# Patient Record
Sex: Male | Born: 1995 | Race: Black or African American | Hispanic: No | Marital: Single | Smoking: Current every day smoker
Health system: Southern US, Community
[De-identification: ages and names within clinical notes are randomized; demographics above are authoritative.]

---
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
## 2017-11-02 ENCOUNTER — Emergency Department (HOSPITAL_BASED_OUTPATIENT_CLINIC_OR_DEPARTMENT_OTHER)
Admission: EM | Admit: 2017-11-02 | Discharge: 2017-11-02 | Disposition: A | Discharge status: Home / Self Care | Payer: 59 | Attending: Emergency Medicine | Admitting: Emergency Medicine

## 2017-11-02 ENCOUNTER — Encounter (HOSPITAL_BASED_OUTPATIENT_CLINIC_OR_DEPARTMENT_OTHER): Payer: Self-pay | Admitting: Emergency Medicine

## 2017-11-02 ENCOUNTER — Other Ambulatory Visit: Payer: Self-pay

## 2017-11-02 DIAGNOSIS — L02414 Cutaneous abscess of left upper limb: Secondary | ICD-10-CM | POA: Insufficient documentation

## 2017-11-02 MED ORDER — SULFAMETHOXAZOLE-TRIMETHOPRIM 800-160 MG PO TABS: 1.0000 | ORAL_TABLET | Freq: Two times a day (BID) | ORAL | 0 refills | Status: AC

## 2017-11-02 NOTE — Discharge Instructions (Signed)
If the area becomes bigger, more painful, or you have red streaks coming from it, fevers, vomiting, or any other new/concerning symptoms and return to the ER for evaluation.  Take the antibiotics to completion, even if you are feeling better.

## 2017-11-02 NOTE — ED Provider Notes (Signed)
[facility] EMERGENCY DEPARTMENT Provider Note   CSN: [id number] Arrival date & time: 11/02/17  0915     History   Chief Complaint Chief Complaint  Patient presents with  . Abscess    HPI Manuel Piesustin Mimnaugh is a 22 y.o. male.  HPI  22 year old male presents with an abscess to the left forearm.  He does not remember any trauma.  He denies any drug use or past medical problems.  He states that started about 3 days ago and started like a pimple.  He tried alcohol on it but it had no relief.  He did take the top off and has had some drainage since.  Some white with blood as well as a little bit of green.  No fevers.  Seems a little smaller today than he did yesterday.  History reviewed. No pertinent past medical history.  There are no active problems to display for this patient.   History reviewed. No pertinent surgical history.      Home Medications    Prior to Admission medications   Medication Sig Start Date End Date Taking? Authorizing Provider  Neomycin-Bacitracin-Polymyxin (NEOSPORIN EX) Apply topically.    [provider]    Family History No family history on file.  Social History Social History   Tobacco Use  . Smoking status: Never Smoker  . Smokeless tobacco: Never Used  Substance Use Topics  . Alcohol use: Yes  . Drug use: No     Allergies   Patient has no known allergies.   Review of Systems Review of Systems  Constitutional: Negative for fever.  Skin: Positive for wound.     Physical Exam Updated Vital Signs BP 116/62 (BP Location: Right Arm)   Pulse 66   Temp 98 F (36.7 C) (Oral)   Resp 16   Ht 6\' 1"  (1.854 m)   Wt 104.3 kg   SpO2 100%   BMI 30.34 kg/m   Physical Exam  Constitutional: He is oriented to person, place, and time. He appears well-developed and well-nourished. No distress.  HENT:  Head: Normocephalic.  Eyes: Right eye exhibits no discharge. Left eye exhibits no discharge.  Cardiovascular: Regular  rhythm.  Pulmonary/Chest: Effort normal.  Abdominal: He exhibits no distension.  Musculoskeletal:       Arms: Neurological: He is alert and oriented to person, place, and time.  Skin: Skin is warm and dry. He is not diaphoretic. No erythema.  Nursing note and vitals reviewed.      ED Treatments / Results  Labs (all labs ordered are listed, but only abnormal results are displayed) Labs Reviewed - No data to display  EKG None  Radiology No results found.  Procedures Procedures (including critical care time)  Medications Ordered in ED Medications - No data to display   Initial Impression / Assessment and Plan / ED Course  I have reviewed the triage vital signs and the nursing notes.  Pertinent labs & imaging results that were available during my care of the patient were reviewed by me and considered in my medical decision making (see chart for details).     After clearing away the pus and expressing his small amount of pus further, no more is able to be expressed.  This feels very superficial and I do not think incision and drainage would be beneficial.  Given it is actively draining I think is reasonable to continue wound care and give a few days of antibiotics.  Given the pus I will prescribe  Bactrim.  No systemic signs or symptoms.  Discussed return precautions and wound care.  Final Clinical Impressions(s) / ED Diagnoses   Final diagnoses:  Abscess of left forearm    ED Discharge Orders    None       Pricilla Loveless, MD 11/02/17 1001

## 2017-11-02 NOTE — ED Triage Notes (Signed)
Abscess to L arm.

## 2018-03-26 ENCOUNTER — Encounter (HOSPITAL_BASED_OUTPATIENT_CLINIC_OR_DEPARTMENT_OTHER): Payer: Self-pay | Admitting: Emergency Medicine

## 2018-03-26 ENCOUNTER — Other Ambulatory Visit: Payer: Self-pay

## 2018-03-26 ENCOUNTER — Emergency Department (HOSPITAL_BASED_OUTPATIENT_CLINIC_OR_DEPARTMENT_OTHER)
Admission: EM | Admit: 2018-03-26 | Discharge: 2018-03-26 | Disposition: A | Discharge status: Home / Self Care | Payer: Self-pay | Attending: Emergency Medicine | Admitting: Emergency Medicine

## 2018-03-26 DIAGNOSIS — J069 Acute upper respiratory infection, unspecified: Secondary | ICD-10-CM | POA: Insufficient documentation

## 2018-03-26 DIAGNOSIS — F17210 Nicotine dependence, cigarettes, uncomplicated: Secondary | ICD-10-CM | POA: Insufficient documentation

## 2018-03-26 MED ORDER — BENZONATATE 100 MG PO CAPS: 100.0000 mg | ORAL_CAPSULE | Freq: Three times a day (TID) | ORAL | 0 refills | Status: AC

## 2018-03-26 MED ORDER — FLUTICASONE PROPIONATE 50 MCG/ACT NA SUSP: 1.0000 | Freq: Every day | NASAL | 0 refills | Status: AC

## 2018-03-26 MED ORDER — IBUPROFEN 800 MG PO TABS: 800.0000 mg | ORAL_TABLET | Freq: Three times a day (TID) | ORAL | 0 refills | Status: AC | PRN

## 2018-03-26 NOTE — ED Provider Notes (Signed)
[facility] EMERGENCY DEPARTMENT Provider Note   CSN: [id number] Arrival date & time: 03/26/18  1036     History   Chief Complaint Chief Complaint  Patient presents with  . Cough  . Sore Throat  . Nasal Congestion    HPI Manuel Alvarado is a 23 y.o. male with a hx of tobacco abuse who presents to the ED with complaints of URI symptoms for the past 2 days.  Patient reports congestion, rhinorrhea, sore throat, sinus pressure, and cough productive of green to yellow mucus sputum.  Symptoms are constant.  No specific alleviating or aggravating factors.  He states he did try Sudafed without significant relief.  Denies fever, chills, generalized body aches, chest pain, or shortness of breath.  HPI  History reviewed. No pertinent past medical history.  There are no active problems to display for this patient.   History reviewed. No pertinent surgical history.      Home Medications    Prior to Admission medications   Medication Sig Start Date End Date Taking? Authorizing Provider  Neomycin-Bacitracin-Polymyxin (NEOSPORIN EX) Apply topically.    [provider]    Family History History reviewed. No pertinent family history.  Social History Social History   Tobacco Use  . Smoking status: Current Every Day Smoker    Types: Cigarettes  . Smokeless tobacco: Never Used  Substance Use Topics  . Alcohol use: Yes  . Drug use: No     Allergies   Patient has no known allergies.   Review of Systems Review of Systems  Constitutional: Negative for chills and fever.  HENT: Positive for congestion, sinus pressure and sore throat. Negative for ear pain.   Respiratory: Positive for cough. Negative for shortness of breath.   Cardiovascular: Negative for chest pain.  Gastrointestinal: Negative for abdominal pain.  Musculoskeletal: Negative for myalgias.     Physical Exam Updated Vital Signs BP 124/79   Pulse 98   Temp 99.1 F (37.3 C) (Oral)   Resp  16   Ht 6\' 1"  (1.854 m)   Wt 104.3 kg   SpO2 99%   BMI 30.34 kg/m   Physical Exam Vitals signs and nursing note reviewed.  Constitutional:      General: He is not in acute distress.    Appearance: He is well-developed. He is not toxic-appearing.  HENT:     Head: Normocephalic and atraumatic.     Right Ear: Tympanic membrane, ear canal and external ear normal.     Left Ear: Tympanic membrane, ear canal and external ear normal.     Nose: Congestion present.     Right Sinus: No maxillary sinus tenderness or frontal sinus tenderness.     Left Sinus: No maxillary sinus tenderness or frontal sinus tenderness.     Mouth/Throat:     Pharynx: Uvula midline. Posterior oropharyngeal erythema (mild) present. No oropharyngeal exudate.     Comments: Posterior oropharynx is symmetric appearing. Patient tolerating own secretions without difficulty. No trismus. No drooling. No hot potato voice. No swelling beneath the tongue, submandibular compartment is soft.  Eyes:     General:        Right eye: No discharge.        Left eye: No discharge.     Conjunctiva/sclera: Conjunctivae normal.  Neck:     Musculoskeletal: Normal range of motion and neck supple. No edema or neck rigidity.  Cardiovascular:     Rate and Rhythm: Normal rate and regular rhythm.  Pulmonary:  Effort: Pulmonary effort is normal. No respiratory distress.     Breath sounds: Normal breath sounds. No wheezing, rhonchi or rales.  Abdominal:     General: There is no distension.     Palpations: Abdomen is soft.     Tenderness: There is no abdominal tenderness.  Lymphadenopathy:     Cervical: No cervical adenopathy.  Skin:    General: Skin is warm and dry.     Findings: No rash.  Neurological:     Mental Status: He is alert.     Comments: Clear speech.   Psychiatric:        Behavior: Behavior normal.    ED Treatments / Results  Labs (all labs ordered are listed, but only abnormal results are displayed) Labs Reviewed -  No data to display  EKG None  Radiology No results found.  Procedures Procedures (including critical care time)  Medications Ordered in ED Medications - No data to display   Initial Impression / Assessment and Plan / ED Course  I have reviewed the triage vital signs and the nursing notes.  Pertinent labs & imaging results that were available during my care of the patient were reviewed by me and considered in my medical decision making (see chart for details).    Patient presents with URI type symptoms.  Patient is nontoxic appearing, in no apparent distress, vitals are WNL. Patient is afebrile in the ED, lungs are CTA, doubt pneumonia. There is no wheezing or signs of respiratory distress. Sxs onset < 7 days, afebrile, no sinus tenderness, doubt acute bacterial sinusitis. Centor score 0, doubt strep pharyngitis. No evidence of AOM on exam. No meningeal signs. Suspect viral  etiology at this time and will treat supportively with Ibuprofen, Flonase, and Tessalon. I discussed results, treatment plan, need for PCP follow-up, and return precautions with the patient. Provided opportunity for questions, patient confirmed understanding and is in agreement with plan.   Final Clinical Impressions(s) / ED Diagnoses   Final diagnoses:  Upper respiratory tract infection, unspecified type    ED Discharge Orders         Ordered    fluticasone (FLONASE) 50 MCG/ACT nasal spray  Daily     03/26/18 1247    benzonatate (TESSALON) 100 MG capsule  Every 8 hours     03/26/18 1247    ibuprofen (ADVIL,MOTRIN) 800 MG tablet  Every 8 hours PRN     03/26/18 1247           Ameliyah Sarno, Ottosen R, PA-C 03/26/18 1248    Tegeler, Canary Brim, MD 03/26/18 1525

## 2018-03-26 NOTE — ED Notes (Signed)
E-sign unavailable

## 2018-03-26 NOTE — ED Triage Notes (Signed)
Pt here with flu-like sx x 2days. Says no relief with meds. Denies fever.

## 2018-03-26 NOTE — Discharge Instructions (Addendum)
You were seen in the emergency today for upper respiratory symptoms, we suspect your symptoms are related to allergies or a virus at this time.  We have prescribed you multiple medications to treat your symptoms.   -Flonase to be used 1 spray in each nostril daily.  This medication is used to treat your congestion.  -Tessalon can be taken once every 8 hours as needed.  This medication is used to treat your cough.  -Ibuprofen to be taken once every 8 hours as needed for pain. Please take this medicine with food as it can cause stomach upset and at worst stomach bleeding. Do not take other NSAIDs such as motrin, aleve, advil, naproxen, mobic, etc as they are similar. You make take tylenol per over the counter dosing with this medicine safely.  We have prescribed you new medication(s) today. Discuss the medications prescribed today with your pharmacist as they can have adverse effects and interactions with your other medicines including over the counter and prescribed medications. Seek medical evaluation if you start to experience new or abnormal symptoms after taking one of these medicines, seek care immediately if you start to experience difficulty breathing, feeling of your throat closing, facial swelling, or rash as these could be indications of a more serious allergic reaction  You will need to follow-up with your primary care provider in 1 week if your symptoms have not improved.  If you do not have a primary care provider one is provided in your discharge instructions.  Return to the emergency department for any new or worsening symptoms including but not limited to persistent fever for 5 days, difficulty breathing, chest pain, rashes, passing out, or any other concerns.

## 2018-03-26 NOTE — ED Notes (Signed)
NAD at this time. Pt is stable and going home.  

## 2019-12-25 ENCOUNTER — Emergency Department (HOSPITAL_BASED_OUTPATIENT_CLINIC_OR_DEPARTMENT_OTHER)
Admission: EM | Admit: 2019-12-25 | Discharge: 2019-12-25 | Disposition: A | Discharge status: Home / Self Care | Payer: 59

## 2019-12-25 ENCOUNTER — Other Ambulatory Visit: Payer: Self-pay

## 2019-12-26 ENCOUNTER — Emergency Department (HOSPITAL_BASED_OUTPATIENT_CLINIC_OR_DEPARTMENT_OTHER)
Admission: EM | Admit: 2019-12-26 | Discharge: 2019-12-26 | Disposition: A | Discharge status: Home / Self Care | Payer: 59 | Attending: Emergency Medicine | Admitting: Emergency Medicine

## 2019-12-26 ENCOUNTER — Encounter (HOSPITAL_BASED_OUTPATIENT_CLINIC_OR_DEPARTMENT_OTHER): Payer: Self-pay | Admitting: Emergency Medicine

## 2019-12-26 DIAGNOSIS — W57XXXA Bitten or stung by nonvenomous insect and other nonvenomous arthropods, initial encounter: Secondary | ICD-10-CM | POA: Diagnosis not present

## 2019-12-26 DIAGNOSIS — S40862A Insect bite (nonvenomous) of left upper arm, initial encounter: Secondary | ICD-10-CM | POA: Diagnosis not present

## 2019-12-26 DIAGNOSIS — F17210 Nicotine dependence, cigarettes, uncomplicated: Secondary | ICD-10-CM | POA: Insufficient documentation

## 2019-12-26 MED ORDER — DIPHENHYDRAMINE HCL 25 MG PO TABS: 25.0000 mg | ORAL_TABLET | Freq: Three times a day (TID) | ORAL | 0 refills | Status: AC | PRN

## 2019-12-26 NOTE — ED Provider Notes (Signed)
[facility] EMERGENCY DEPARTMENT Provider Note   CSN: [id number] Arrival date & time: 12/26/19  3267     History Chief Complaint  Patient presents with  . Insect Bite    Manuel Alvarado is a 24 y.o. male.  The history is provided by the patient.  Animal Bite Contact animal:  Insect Location:  Shoulder/arm Shoulder/arm injury location:  L upper arm Time since incident:  1 day Pain details:    Quality:  Aching and burning   Severity:  Mild Relieved by:  Nothing Worsened by:  Nothing Associated symptoms: swelling   Associated symptoms: no fever, no numbness and no rash        History reviewed. No pertinent past medical history.  There are no problems to display for this patient.   History reviewed. No pertinent surgical history.     History reviewed. No pertinent family history.  Social History   Tobacco Use  . Smoking status: Current Every Day Smoker    Types: Cigarettes  . Smokeless tobacco: Never Used  Substance Use Topics  . Alcohol use: Yes  . Drug use: No    Home Medications Prior to Admission medications   Medication Sig Start Date End Date Taking? Authorizing Provider  benzonatate (TESSALON) 100 MG capsule Take 1 capsule (100 mg total) by mouth every 8 (eight) hours. 03/26/18   Petrucelli, Samantha R, PA-C  diphenhydrAMINE (BENADRYL) 25 MG tablet Take 1 tablet (25 mg total) by mouth every 8 (eight) hours as needed for up to 30 doses for itching or allergies. 12/26/19   Sarajane Fambrough, DO  fluticasone (FLONASE) 50 MCG/ACT nasal spray Place 1 spray into both nostrils daily. 03/26/18   Petrucelli, Samantha R, PA-C  ibuprofen (ADVIL,MOTRIN) 800 MG tablet Take 1 tablet (800 mg total) by mouth every 8 (eight) hours as needed. 03/26/18   Petrucelli, Pleas Koch, PA-C    Allergies    Patient has no known allergies.  Review of Systems   Review of Systems  Constitutional: Negative for fever.  Musculoskeletal: Negative for arthralgias and back  pain.  Skin: Positive for color change. Negative for pallor, rash and wound.  Neurological: Negative for weakness and numbness.    Physical Exam Updated Vital Signs BP 136/88   Pulse 98   Temp 98.2 F (36.8 C)   Resp 18   SpO2 98%   Physical Exam Constitutional:      General: He is not in acute distress.    Appearance: He is not ill-appearing.  Cardiovascular:     Pulses: Normal pulses.  Musculoskeletal:        General: No swelling or tenderness. Normal range of motion.  Skin:    General: Skin is warm.     Capillary Refill: Capillary refill takes less than 2 seconds.     Findings: Rash present. No erythema or lesion.  Neurological:     General: No focal deficit present.     Mental Status: He is alert.     Sensory: No sensory deficit.     Motor: No weakness.     Comments: 5+ out of 5 strength in bilateral upper extremities, normal sensation     ED Results / Procedures / Treatments   Labs (all labs ordered are listed, but only abnormal results are displayed) Labs Reviewed - No data to display  EKG None  Radiology No results found.  Procedures Procedures (including critical care time)  Medications Ordered in ED Medications - No data to display  ED Course  I have reviewed the triage vital signs and the nursing notes.  Pertinent labs & imaging results that were available during my care of the patient were reviewed by me and considered in my medical decision making (see chart for details).    MDM Rules/Calculators/A&P                          Patient here for insect bite to left upper arm.  Has had some itchiness and swelling since yesterday when bite first occurred.  Normal vitals.  No fever.  Neuromuscularly neurovascularly intact on exam.  Has a small area of redness where possibly was bit by an insect.  Likely a mild allergic process.  Has not taken anything for this.  Recommend Benadryl and overall given reassurance.  Discharged in good condition.  This  chart was dictated using voice recognition software.  Despite best efforts to proofread,  errors can occur which can change the documentation meaning.   Final Clinical Impression(s) / ED Diagnoses Final diagnoses:  Insect bite, unspecified site, initial encounter    Rx / DC Orders ED Discharge Orders         Ordered    diphenhydrAMINE (BENADRYL) 25 MG tablet  Every 8 hours PRN        12/26/19 0750           Virgina Norfolk, DO 12/26/19 [phone]

## 2019-12-26 NOTE — ED Triage Notes (Signed)
Pt here with an insect bite occurring yesterday to left upper arm. Redness, pain and swelling apparent.

## 2019-12-26 NOTE — Discharge Instructions (Signed)
Recommend taking Benadryl 25 mg every 8 hours as needed for itchiness and allergic type symptoms
